# Patient Record
Sex: Male | Born: 1990 | Race: Black or African American | Hispanic: No | Marital: Single | Smoking: Never smoker
Health system: Southern US, Community
[De-identification: ages and names within clinical notes are randomized; demographics above are authoritative.]

## PROBLEM LIST (undated history)

## (undated) HISTORY — PX: HAND SURGERY: SHX662

## (undated) HISTORY — PX: KNEE SURGERY: SHX244

## (undated) HISTORY — PX: FINGER SURGERY: SHX640

---
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
## 2010-06-01 NOTE — Op Note (Signed)
Kyle Krueger, Kyle Krueger                         ACCOUNT NO.:  [id number]   MEDICAL RECORD NO.:  [id number]                   PATIENT TYPE:  AMB   LOCATION:  [facility]                                  FACILITY:  [facility]   PHYSICIAN:  Lowell Bouton, M.D.      DATE OF BIRTH:  September 07, 1990   DATE OF PROCEDURE:  06/03/2003  DATE OF DISCHARGE:                                 OPERATIVE REPORT   PREOPERATIVE DIAGNOSIS:  Closed intra-articular fracture proximal phalanx  left index finger.   POSTOPERATIVE DIAGNOSIS:  Closed intra-articular fracture proximal phalanx  left index finger.   PROCEDURE:  Closed reduction and percutaneous pinning of proximal phalanx  fracture, left index finger.   SURGEON:  Dr. Metro Kung   ANESTHESIA:  General.   OPERATIVE FINDINGS:  The patient had an oblique fracture through the distal  part of the proximal phalanx that extended into the PIP joint.  There was a  slight step-off in the joint.   DESCRIPTION OF PROCEDURE:  Under general anesthesia, the left hand was  prepped and draped in the usual fashion and under x-ray control, the  fracture was reduced.  A 3.5 K-wire was placed transversely across the  fracture, and x-rays showed good alignment.  This was done percutaneously.  A second K-wire was placed parallel to the first more distally and was  placed across the fracture site.  The pins were bent over, left protruding  through the skin.  X-rays revealed good alignment.  Sterile dressings were  applied followed by an aluminum foam splint.  The patient tolerated the  procedure well and went to the recovery room awake and stable in good  condition.                                               Lowell Bouton, M.D.    EMM/MEDQ  D:  06/03/2003  T:  06/04/2003  Job:  [id number]   cc:   Angus Seller. Rana Snare, M.D.  [address] [place]  [place] [address]  Fax: [phone]

## 2011-02-03 ENCOUNTER — Emergency Department (INDEPENDENT_AMBULATORY_CARE_PROVIDER_SITE_OTHER)
Admission: EM | Admit: 2011-02-03 | Discharge: 2011-02-03 | Disposition: A | Discharge status: Home / Self Care | Payer: 59 | Source: Home / Self Care | Attending: Family Medicine | Admitting: Family Medicine

## 2011-02-03 ENCOUNTER — Encounter (HOSPITAL_COMMUNITY): Payer: Self-pay | Admitting: *Deleted

## 2011-02-03 DIAGNOSIS — S0181XA Laceration without foreign body of other part of head, initial encounter: Secondary | ICD-10-CM

## 2011-02-03 DIAGNOSIS — S0180XA Unspecified open wound of other part of head, initial encounter: Secondary | ICD-10-CM

## 2011-02-03 NOTE — ED Provider Notes (Signed)
History     CSN: [id number]  Arrival date & time 02/03/11  4098   First MD Initiated Contact with Patient 02/03/11 1834      Chief Complaint  Patient presents with  . Laceration    (Consider location/radiation/quality/duration/timing/severity/associated sxs/prior treatment) Patient is a 21 y.o. male presenting with skin laceration. The history is provided by the patient.  Laceration  The incident occurred 1 to 2 hours ago. The laceration is located on the face. The laceration is 1 cm in size. The laceration mechanism was a a blunt object (struck with elbow playing basketball). The patient is experiencing no pain. He reports no foreign bodies present. His tetanus status is UTD.    History reviewed. No pertinent past medical history.  History reviewed. No pertinent past surgical history.  History reviewed. No pertinent family history.  History  Substance Use Topics  . Smoking status: Never Smoker   . Smokeless tobacco: Not on file  . Alcohol Use: No      Review of Systems  Constitutional: Negative.   Eyes: Negative.   Skin: Positive for wound.  Neurological: Negative.     Allergies  Review of patient's allergies indicates no known allergies.  Home Medications  No current outpatient prescriptions on file.  BP 132/87  Pulse 56  Temp(Src) 98.9 F (37.2 C) (Oral)  Resp 16  SpO2 100%  Physical Exam  Nursing note and vitals reviewed. Constitutional: He is oriented to person, place, and time. He appears well-developed and well-nourished.  HENT:  Head: Normocephalic.  Eyes: Conjunctivae and EOM are normal. Pupils are equal, round, and reactive to light.  Neurological: He is alert and oriented to person, place, and time.  Skin: Skin is warm and dry.  Psychiatric: He has a normal mood and affect.    ED Course  LACERATION REPAIR Date/Time: 02/03/2011 7:02 PM Performed by: Barkley Bruns Authorized by: Barkley Bruns Consent: Verbal consent  obtained. Consent given by: patient Patient understanding: patient states understanding of the procedure being performed Body area: head/neck Location details: left cheek Laceration length: 1.5 cm Anesthesia: local infiltration Local anesthetic: lidocaine 2% with epinephrine Anesthetic total: 2 ml Patient sedated: no Preparation: Patient was prepped and draped in the usual sterile fashion. Irrigation solution: saline Amount of cleaning: standard Debridement: none Degree of undermining: none Skin closure: 6-0 Prolene Technique: running Approximation: close Approximation difficulty: simple Dressing: antibiotic ointment Patient tolerance: Patient tolerated the procedure well with no immediate complications.   (including critical care time)  Labs Reviewed - No data to display No results found.   1. Facial laceration       MDM  Lac repair        Barkley Bruns, MD 02/03/11 1907

## 2011-02-03 NOTE — ED Notes (Addendum)
Pt with laceration under left eye playing basketball other player elbow  tetnus approx one year ago

## 2011-02-08 ENCOUNTER — Emergency Department (HOSPITAL_COMMUNITY)
Admission: EM | Admit: 2011-02-08 | Discharge: 2011-02-08 | Disposition: A | Discharge status: Home / Self Care | Payer: 59 | Source: Home / Self Care | Attending: Family Medicine | Admitting: Family Medicine

## 2011-02-08 ENCOUNTER — Encounter (HOSPITAL_COMMUNITY): Payer: Self-pay | Admitting: *Deleted

## 2011-02-08 DIAGNOSIS — Z4802 Encounter for removal of sutures: Secondary | ICD-10-CM

## 2011-02-08 NOTE — ED Provider Notes (Signed)
History     CSN: [id number]  Arrival date & time 02/08/11  1449   First MD Initiated Contact with Patient 02/08/11 1530      Chief Complaint  Patient presents with  . Suture / Staple Removal    (Consider location/radiation/quality/duration/timing/severity/associated sxs/prior treatment) Patient is a 21 y.o. male presenting with suture removal. The history is provided by the patient.  Suture / Staple Removal  The sutures were placed 3 to 6 days ago. Treatments since wound repair include antibiotic ointment use. There has been no drainage from the wound. There is no redness present. There is no swelling present. The pain has no pain.    History reviewed. No pertinent past medical history.  History reviewed. No pertinent past surgical history.  History reviewed. No pertinent family history.  History  Substance Use Topics  . Smoking status: Never Smoker   . Smokeless tobacco: Not on file  . Alcohol Use: No      Review of Systems  Constitutional: Negative.   Skin: Positive for wound.    Allergies  Review of patient's allergies indicates no known allergies.  Home Medications  No current outpatient prescriptions on file.  BP 107/61  Pulse 78  Temp(Src) 98.3 F (36.8 C) (Oral)  Resp 14  SpO2 98%  Physical Exam  Nursing note and vitals reviewed. Constitutional: He appears well-developed and well-nourished.  Skin: Skin is warm and dry.       Lac well healed, sutures removed.    ED Course  Procedures (including critical care time)  Labs Reviewed - No data to display No results found.   1. Visit for suture removal       MDM          Barkley Bruns, MD 02/08/11 1550

## 2011-02-08 NOTE — ED Notes (Signed)
Well  Healing  Sutures  Under  l  Eye  Here  For  Removal

## 2011-08-12 ENCOUNTER — Emergency Department (INDEPENDENT_AMBULATORY_CARE_PROVIDER_SITE_OTHER)
Admission: EM | Admit: 2011-08-12 | Discharge: 2011-08-12 | Disposition: A | Discharge status: Home / Self Care | Payer: 59 | Source: Home / Self Care | Attending: Emergency Medicine | Admitting: Emergency Medicine

## 2011-08-12 ENCOUNTER — Emergency Department (INDEPENDENT_AMBULATORY_CARE_PROVIDER_SITE_OTHER): Payer: 59

## 2011-08-12 ENCOUNTER — Encounter (HOSPITAL_COMMUNITY): Payer: Self-pay | Admitting: *Deleted

## 2011-08-12 DIAGNOSIS — S63509A Unspecified sprain of unspecified wrist, initial encounter: Secondary | ICD-10-CM

## 2011-08-12 MED ORDER — MELOXICAM 15 MG PO TABS: 15.0000 mg | ORAL_TABLET | Freq: Every day | ORAL | Status: DC

## 2011-08-12 MED ORDER — MELOXICAM 15 MG PO TABS: 15.0000 mg | ORAL_TABLET | Freq: Every day | ORAL | Status: AC

## 2011-08-12 MED ORDER — TRAMADOL HCL 50 MG PO TABS: 100.0000 mg | ORAL_TABLET | Freq: Three times a day (TID) | ORAL | Status: DC | PRN

## 2011-08-12 MED ORDER — TRAMADOL HCL 50 MG PO TABS: 100.0000 mg | ORAL_TABLET | Freq: Three times a day (TID) | ORAL | Status: AC | PRN

## 2011-08-12 NOTE — ED Provider Notes (Signed)
Chief Complaint  Patient presents with  . Wrist Pain    History of Present Illness:   Mr. Purohit is a 21 year old male who injured his left wrist 3 days ago. He got it caught in some tent cords at work. It was twisted. He's had pain over the ulnar aspect of the wrist ever since then. It was a little bit swollen the first couple days but now the swelling has gone down. He has pain with flexion and extension and with lateral deviation. He denies any numbness or tingling.  Review of Systems:  Other than noted above, the patient denies any of the following symptoms: Systemic:  No fevers, chills, sweats, or aches.  No fatigue or tiredness. Musculoskeletal:  No joint pain, arthritis, bursitis, swelling, back pain, or neck pain. Neurological:  No muscular weakness, paresthesias, headache, or trouble with speech or coordination.  No dizziness.   PMFSH:  Past medical history, family history, social history, meds, and allergies were reviewed.  Physical Exam:   Vital signs:  BP 138/62  Pulse 70  Temp 98.5 F (36.9 C) (Oral)  Resp 18  SpO2 100% Gen:  Alert and oriented times 3.  In no distress. Musculoskeletal: There is pain to palpation over the ulnar aspect of the wrist. No swelling, bruising, or deformity. The wrist has a full range of motion. There is no pain on flexion but some pain on extension and lateral deviation. Otherwise, all joints had a full a ROM with no swelling, bruising or deformity.  No edema, pulses full. Extremities were warm and pink.  Capillary refill was brisk.  Skin:  Clear, warm and dry.  No rash. Neuro:  Alert and oriented times 3.  Muscle strength was normal.  Sensation was intact to light touch.   Radiology:  Dg Wrist Complete Left  08/12/2011  *RADIOLOGY REPORT*  Clinical Data: Injury  LEFT WRIST - COMPLETE 3+ VIEW  Comparison: None.  Findings: Four views of the left wrist submitted. No acute fracture or subluxation.  No radiopaque foreign body.  IMPRESSION: No acute  fracture or subluxation.  Original Report Authenticated By: Natasha Mead, M.D.   Course in Urgent Care Center:   The patient was put in a Velcro wrist splint.  Assessment:  The encounter diagnosis was Wrist sprain.  Plan:   1.  The following meds were prescribed:   New Prescriptions   MELOXICAM (MOBIC) 15 MG TABLET    Take 1 tablet (15 mg total) by mouth daily.   TRAMADOL (ULTRAM) 50 MG TABLET    Take 2 tablets (100 mg total) by mouth every 8 (eight) hours as needed for pain.   2.  The patient was instructed in symptomatic care, including rest and activity, elevation, application of ice and compression.  Appropriate handouts were given. 3.  The patient was told to return if becoming worse in any way, if no better in 3 or 4 days, and given some red flag symptoms that would indicate earlier return.   4.  The patient was told to follow up here if no better in 2 weeks.   Reuben Likes, MD 08/12/11 [phone]

## 2011-08-12 NOTE — ED Notes (Signed)
Pt c/o left wrist pain /injury onset yesterday - per pt hand caught in tent strings twisted wrist

## 2011-10-28 ENCOUNTER — Emergency Department (HOSPITAL_COMMUNITY)
Admission: EM | Admit: 2011-10-28 | Discharge: 2011-10-28 | Disposition: A | Discharge status: Home / Self Care | Payer: 59 | Source: Home / Self Care | Attending: Emergency Medicine | Admitting: Emergency Medicine

## 2011-10-28 ENCOUNTER — Emergency Department (INDEPENDENT_AMBULATORY_CARE_PROVIDER_SITE_OTHER): Payer: 59

## 2011-10-28 ENCOUNTER — Encounter (HOSPITAL_COMMUNITY): Payer: Self-pay

## 2011-10-28 DIAGNOSIS — S4980XA Other specified injuries of shoulder and upper arm, unspecified arm, initial encounter: Secondary | ICD-10-CM

## 2011-10-28 DIAGNOSIS — S161XXA Strain of muscle, fascia and tendon at neck level, initial encounter: Secondary | ICD-10-CM

## 2011-10-28 DIAGNOSIS — S20219A Contusion of unspecified front wall of thorax, initial encounter: Secondary | ICD-10-CM

## 2011-10-28 DIAGNOSIS — S139XXA Sprain of joints and ligaments of unspecified parts of neck, initial encounter: Secondary | ICD-10-CM

## 2011-10-28 DIAGNOSIS — S43429A Sprain of unspecified rotator cuff capsule, initial encounter: Secondary | ICD-10-CM

## 2011-10-28 MED ORDER — MELOXICAM 15 MG PO TABS: 15.0000 mg | ORAL_TABLET | Freq: Every day | ORAL | Status: AC

## 2011-10-28 MED ORDER — TRAMADOL HCL 50 MG PO TABS: 100.0000 mg | ORAL_TABLET | Freq: Three times a day (TID) | ORAL | Status: AC | PRN

## 2011-10-28 NOTE — ED Notes (Signed)
States played football yest, and was tackled several times. Since then has had pain in neck, shoulder, chest, hurts to breathe ; NAD: pain w ROM right arm

## 2011-10-28 NOTE — ED Provider Notes (Signed)
Chief Complaint  Patient presents with  . Muscle Pain    History of Present Illness:  The patient is a 21 year old male who was playing football yesterday. He received several hits, but cannot recall any particularly severe injuries. After the game he had some aching and stiffness in his neck. After a while of this progressed to his right shoulder, right collarbone, and today is also progressed to the entire right hemithorax with worsening with deep inspiration. He feels somewhat short of breath but has had no cough or hemoptysis. He denies any headache or blurry vision. He is able to move his neck well but does have some pain. He denies any midline pain. He's had no swelling or bruising anywhere. He has pain with movement of the shoulder. He denies any pain radiating down the arm, numbness, tingling, or weakness. He denies any palpitations, presyncope, or syncope. He has no abdominal pain or lower extremity pain.  Review of Systems:  Other than noted above, the patient denies any of the following symptoms. Systemic:  No fever, chills, sweats, or fatigue. ENT:  No nasal congestion, rhinorrhea, or sore throat. Pulmonary:  No cough, wheezing, shortness of breath, sputum production, hemoptysis. Cardiac:  No palpitations, rapid heartbeat, dizziness, presyncope or syncope. GI:  No abdominal pain, heartburn, nausea, or vomiting. Ext:  No leg pain or swelling.  PMFSH:  Past medical history, family history, social history, meds, and allergies were reviewed and updated as needed.   Physical Exam:   Vital signs:  BP 159/51  Pulse 57  Temp 98.5 F (36.9 C) (Oral)  Resp 10 Gen:  Alert, oriented, in no distress, skin warm and dry. Eye:  PERRL, lids and conjunctivas normal.  Sclera non-icteric. ENT:  Mucous membranes moist, pharynx clear. Neck:  There is tenderness to palpation over the right trapezius ridge and over the collarbone, but no swelling or bruising. No obvious deformity. The neck had a full  range of motion with slight pain at the endpoints of movement. There was no pain to palpation at the midline. Lungs:  Clear to auscultation, no wheezes, rales or rhonchi.  No respiratory distress. Heart:  Regular rhythm.  No gallops, murmers, clicks or rubs. Chest:  He has some pain to palpation over the sternum, but not over the ribs. Abdomen:  Soft, nontender, no organomegaly or mass.  Bowel sounds normal.  No pulsatile abdominal mass or bruit. Ext:  There is no pain to palpation in either upper extremity. The shoulder however had a limited range of motion with pain on movement. Skin:  Warm and dry.  No rash.  Radiology:  Dg Ribs Unilateral W/chest Right  10/28/2011  *RADIOLOGY REPORT*  Clinical Data: URI, right clavicle injury, right anterior chest pain with taking a deep breath, question rib fracture  RIGHT RIBS AND CHEST - 3+ VIEW  Comparison: None  Findings: Borderline enlargement cardiac silhouette, likely accentuated by mild hypoinflation. Mediastinal contours and pulmonary vascularity normal. Slight decreased lung volumes without infiltrate, pleural effusion, or pneumothorax. Clavicles appear intact. Osseous mineralization normal. No rib fracture or bone destruction identified.  IMPRESSION: Slightly decreased lung volumes. No definite rib fractures identified.   Original Report Authenticated By: Lollie Marrow, M.D.    Dg Clavicle Right  10/28/2011  *RADIOLOGY REPORT*  Clinical Data: Mid clavicular pain, football injury yesterday  RIGHT CLAVICLE - 2+ VIEWS  Comparison: None.  Findings: Two views of the right clavicle submitted.  No acute fracture or subluxation.  No radiopaque foreign body.  IMPRESSION:  No acute fracture or subluxation.   Original Report Authenticated By: Natasha Mead, M.D.    I reviewed the images independently and personally and concur with the radiologist's findings.  Assessment:  The primary encounter diagnosis was Cervical strain. Diagnoses of Chest wall contusion,  Contusion of clavicle, and Rotator cuff sprain were also pertinent to this visit.  Plan:   1.  The following meds were prescribed:   New Prescriptions   MELOXICAM (MOBIC) 15 MG TABLET    Take 1 tablet (15 mg total) by mouth daily.   TRAMADOL (ULTRAM) 50 MG TABLET    Take 2 tablets (100 mg total) by mouth every 8 (eight) hours as needed for pain.   2.  The patient was instructed in symptomatic care and handouts were given. 3.  The patient was told to return if becoming worse in any way, if no better in 3 or 4 days, and given some red flag symptoms that would indicate earlier return.    Reuben Likes, MD 10/28/11 [phone]

## 2012-07-30 ENCOUNTER — Emergency Department (HOSPITAL_COMMUNITY)
Admission: EM | Admit: 2012-07-30 | Discharge: 2012-07-30 | Disposition: A | Discharge status: Home / Self Care | Payer: 59 | Source: Home / Self Care

## 2012-07-30 DIAGNOSIS — IMO0002 Reserved for concepts with insufficient information to code with codable children: Secondary | ICD-10-CM

## 2012-07-30 DIAGNOSIS — S76311A Strain of muscle, fascia and tendon of the posterior muscle group at thigh level, right thigh, initial encounter: Secondary | ICD-10-CM

## 2012-07-30 MED ORDER — NAPROXEN 500 MG PO TABS: 500.0000 mg | ORAL_TABLET | Freq: Two times a day (BID) | ORAL | Status: AC

## 2012-07-30 MED ORDER — TRAMADOL HCL 50 MG PO TABS: 100.0000 mg | ORAL_TABLET | Freq: Three times a day (TID) | ORAL | Status: AC | PRN

## 2012-07-30 NOTE — ED Notes (Signed)
Patient states was playing basketball on Tuesday when he did hear a pop.  States his right hamstring is hurting radiating down to knee cap.  Ibuprofen taken but did not relieve pain.  Patient did R.I.C.E. The right leg.

## 2012-07-30 NOTE — ED Provider Notes (Signed)
History    CSN: [id number] Arrival date & time 07/30/12  4098  None    Chief Complaint  Patient presents with  . Leg Pain   (Consider location/radiation/quality/duration/timing/severity/associated sxs/prior Treatment) HPI Comments: 22 year old male presents complaining of pain in his right hamstring. He was playing basketball and went to take a step when he felt a pop in the back of his leg and immediate pain. He says that he has pain with any movement as well as pain with anything except keeping the leg exactly straight. He has been attempting to rest it, elevate it, and apply ice as much as possible but it is not helping. He is also taking ibuprofen which did not help much either. He says he is now getting any tender knot in the back of his leg just above the knee, lateral side. He denies history of similar injury. Denies fever, chills, or any swelling distal to the hamstring.  Patient is a 22 y.o. male presenting with leg pain.  Leg Pain Associated symptoms: no fatigue, no fever and no neck pain    No past medical history on file. No past surgical history on file. No family history on file. History  Substance Use Topics  . Smoking status: Never Smoker   . Smokeless tobacco: Not on file  . Alcohol Use: No    Review of Systems  Constitutional: Negative for fever, chills and fatigue.  HENT: Negative for sore throat, neck pain and neck stiffness.   Eyes: Negative for visual disturbance.  Respiratory: Negative for cough and shortness of breath.   Cardiovascular: Negative for chest pain, palpitations and leg swelling.  Gastrointestinal: Negative for nausea, vomiting, abdominal pain, diarrhea and constipation.  Genitourinary: Negative for dysuria, urgency, frequency and hematuria.  Musculoskeletal: Positive for myalgias (See history of present illness). Negative for arthralgias.  Skin: Negative for rash.  Neurological: Negative for dizziness, weakness and light-headedness.     Allergies  Review of patient's allergies indicates no known allergies.  Home Medications   Current Outpatient Rx  Name  Route  Sig  Dispense  Refill  . ibuprofen (ADVIL,MOTRIN) 600 MG tablet   Oral   Take 600 mg by mouth every 6 (six) hours as needed.         . meloxicam (MOBIC) 15 MG tablet   Oral   Take 1 tablet (15 mg total) by mouth daily.   15 tablet   0   . meloxicam (MOBIC) 15 MG tablet   Oral   Take 1 tablet (15 mg total) by mouth daily.   15 tablet   0   . naproxen (NAPROSYN) 500 MG tablet   Oral   Take 1 tablet (500 mg total) by mouth 2 (two) times daily.   60 tablet   2   . traMADol (ULTRAM) 50 MG tablet   Oral   Take 2 tablets (100 mg total) by mouth every 8 (eight) hours as needed for pain.   30 tablet   0   . traMADol (ULTRAM) 50 MG tablet   Oral   Take 2 tablets (100 mg total) by mouth every 8 (eight) hours as needed for pain.   30 tablet   0    BP 127/84  Pulse 61  Temp(Src) 98.4 F (36.9 C) (Oral)  SpO2 97% Physical Exam  Constitutional: He is oriented to person, place, and time. He appears well-developed and well-nourished.  HENT:  Head: Normocephalic and atraumatic.  Neck: Normal range of motion.  Musculoskeletal:  Right upper leg: He exhibits tenderness and swelling.  Bruising of the distal medial hamstrings. Tenderness on the lateral hamstrings up to the mid thigh posteriorly. No tenderness in the buttocks  Neurological: He is alert and oriented to person, place, and time. Coordination normal.  Skin: Skin is warm and dry. No rash noted.  Psychiatric: He has a normal mood and affect. Judgment normal.    ED Course  Procedures (including critical care time) Labs Reviewed - No data to display No results found. 1. Hamstring tear, right, initial encounter    Ultrasound performed by Dr. Denyse Amass reveals tear in the muscle belly with hematoma MDM  The ultrasound shows that this is a tear, rupture is very unlikely. We will  treat this with NSAIDs, tramadol when necessary for pain, and compression Ace wrap for support. The patient will be referred to outpatient sports medicine clinic for followup and physical therapy   Meds ordered this encounter  Medications  . naproxen (NAPROSYN) 500 MG tablet    Sig: Take 1 tablet (500 mg total) by mouth 2 (two) times daily.    Dispense:  60 tablet    Refill:  2  . traMADol (ULTRAM) 50 MG tablet    Sig: Take 2 tablets (100 mg total) by mouth every 8 (eight) hours as needed for pain.    Dispense:  30 tablet    Refill:  0     Graylon Good, PA-C 07/30/12 [phone]

## 2012-07-30 NOTE — ED Provider Notes (Signed)
Limited musculoskeletal ultrasound of the right posterior thigh. Significant superficial hematoma located on the lateral portion of the hamstring muscle belly. Deep hematoma with hypoechoic change in muscle belly disruption located in the midportion of the muscle belly of the lateral hamstring extending approximately 10 cm indicating acute hamstring muscle belly tear.   Rodolph Bong, MD 07/30/12 [phone]

## 2012-07-30 NOTE — ED Provider Notes (Signed)
Medical screening examination/treatment/procedure(s) were performed by a resident physician or non-physician practitioner and as the supervising physician I was immediately available for consultation/collaboration.  Aj Crunkleton, MD   Anushree Dorsi S Lalla Laham, MD 07/30/12 1115 

## 2012-08-03 ENCOUNTER — Encounter (HOSPITAL_COMMUNITY): Payer: Self-pay

## 2012-08-03 ENCOUNTER — Emergency Department (HOSPITAL_COMMUNITY)
Admission: EM | Admit: 2012-08-03 | Discharge: 2012-08-03 | Disposition: A | Discharge status: Home / Self Care | Payer: 59 | Source: Home / Self Care

## 2012-08-03 DIAGNOSIS — S76311D Strain of muscle, fascia and tendon of the posterior muscle group at thigh level, right thigh, subsequent encounter: Secondary | ICD-10-CM

## 2012-08-03 DIAGNOSIS — IMO0002 Reserved for concepts with insufficient information to code with codable children: Secondary | ICD-10-CM

## 2012-08-03 MED ORDER — HYDROCODONE-ACETAMINOPHEN 7.5-325 MG PO TABS: 1.0000 | ORAL_TABLET | ORAL | Status: AC | PRN

## 2012-08-03 NOTE — ED Provider Notes (Signed)
History    CSN: [id number] Arrival date & time 08/03/12  1717  First MD Initiated Contact with Patient 08/03/12 1903     Chief Complaint  Patient presents with  . 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 He was seen at that time and the urgent care. He was treated with a wrap and a limited time out of work and tramadol. He states that he has continued to work and walk all the hospital and now his pain is much worse. Is also an increase in area of deep bruising.  History reviewed. No pertinent past medical history. History reviewed. No pertinent past surgical history. History reviewed. No pertinent family history. History  Substance Use Topics  . Smoking status: Never Smoker   . Smokeless tobacco: Not on file  . Alcohol Use: No    Review of Systems  Constitutional: Negative.   Respiratory: Negative.   Gastrointestinal: Negative.   Genitourinary: Negative.   Musculoskeletal:       As per HPI  Skin: Negative.   Neurological: Negative for dizziness, weakness, numbness and headaches.    Allergies  Review of patient's allergies indicates no known allergies.  Home Medications   Current Outpatient Rx  Name  Route  Sig  Dispense  Refill  . naproxen (NAPROSYN) 500 MG tablet   Oral   Take 1 tablet (500 mg total) by mouth 2 (two) times daily.   60 tablet   2   . traMADol (ULTRAM) 50 MG tablet   Oral   Take 2 tablets (100 mg total) by mouth every 8 (eight) hours as needed for pain.   30 tablet   0   . HYDROcodone-acetaminophen (NORCO) 7.5-325 MG per tablet   Oral   Take 1 tablet by mouth every 4 (four) hours as needed for pain.   16 tablet   0   . ibuprofen (ADVIL,MOTRIN) 600 MG tablet   Oral   Take 600 mg by mouth every 6 (six) hours as needed.          . meloxicam (MOBIC) 15 MG tablet   Oral   Take 1 tablet (15 mg total) by mouth daily.   15 tablet   0   . meloxicam (MOBIC) 15 MG tablet   Oral   Take 1 tablet (15 mg total) by mouth daily.   15 tablet   0   . traMADol (ULTRAM) 50 MG tablet   Oral   Take 2 tablets (100 mg total) by mouth every 8 (eight) hours as needed for pain.   30 tablet   0    BP 136/80  Pulse 67  Temp(Src) 98 F (36.7 C) (Oral)  Resp 14  SpO2 100% Physical Exam  Nursing note and vitals reviewed. Constitutional: He is oriented to person, place, and time. He appears well-developed and well-nourished.  HENT:  Head: Normocephalic and atraumatic.  Eyes: EOM are normal. Left eye exhibits no discharge.  Neck: Normal range of motion. Neck supple.  Musculoskeletal: He exhibits tenderness.  Minimal edema to the hamstring. There is ecchymosis to the distal aspect of the hamstring and lesser to the proximal calcaneus. Flection remains limited to 5. No deformity. Distal neurovascular and sensory is intact.  Neurological: He is alert and oriented to person, place, and time. No cranial nerve deficit.  Skin: Skin is  warm and dry.  Psychiatric: He has a normal mood and affect.    ED Course  Procedures (including critical care time) Labs Reviewed - No data to display No results found. 1. Hamstring muscle strain, right, subsequent encounter     MDM  Norco 7.5 Milligrams every 4 hours when necessary pain #16 Continue to wear the wrap as directed, primarily while standing or walking. He also continued apply ice his lungs it helps. Must call the number given to you last visit for followup and rehabilitation. Footnote to limit walking falling a few feet for the next 5 days.  Hayden Rasmussen, NP 08/03/12 1921

## 2012-08-03 NOTE — ED Provider Notes (Signed)
Medical screening examination/treatment/procedure(s) were performed by resident physician or non-physician practitioner and as supervising physician I was immediately available for consultation/collaboration.   KINDL,JAMES DOUGLAS MD.   James D Kindl, MD 08/03/12 2030 

## 2012-08-03 NOTE — ED Notes (Signed)
Here for worsening pain and swelling in hamstring area

## 2012-08-17 ENCOUNTER — Ambulatory Visit (INDEPENDENT_AMBULATORY_CARE_PROVIDER_SITE_OTHER): Payer: Worker's Compensation | Admitting: Sports Medicine

## 2012-08-17 VITALS — BP 124/70 | Ht 69.0 in | Wt 230.0 lb

## 2012-08-17 DIAGNOSIS — S76312A Strain of muscle, fascia and tendon of the posterior muscle group at thigh level, left thigh, initial encounter: Secondary | ICD-10-CM

## 2012-08-17 DIAGNOSIS — IMO0002 Reserved for concepts with insufficient information to code with codable children: Secondary | ICD-10-CM

## 2012-08-17 NOTE — Progress Notes (Signed)
Kyle Krueger is a 22 y.o. male who presents today for R hamstring injury.  Pt states he was playing basketball around 3-4 weeks ago when he strained his R hamstring.  He had pain at that time but was able to move and made a full recovery.  However, one week later, pt was at work and had a food cart ride up on his achilles, and as he was plantarflexing his R ankle, his leg got stuck and he heard/felt a pop of his R hamstring.  He had severe pain in the posterior thigh as well as immediate edema.  Pt went to urgent care where they dx him with a hamstring tear.  He was started on Naprosyn and Ultram at that time for pain and this did help his pain somewhat.  Pt developed posterior thigh ecchymosis over the last week that he has not seen dissipate over that time as well.  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     Current Outpatient Prescriptions on File Prior to Visit  Medication Sig Dispense Refill  . HYDROcodone-acetaminophen (NORCO) 7.5-325 MG per tablet Take 1 tablet by mouth every 4 (four) hours as needed for pain.  16 tablet  0  . ibuprofen (ADVIL,MOTRIN) 600 MG tablet Take 600 mg by mouth every 6 (six) hours as needed.      . meloxicam (MOBIC) 15 MG tablet Take 1 tablet (15 mg total) by mouth daily.  15 tablet  0  . naproxen (NAPROSYN) 500 MG tablet Take 1 tablet (500 mg total) by mouth 2 (two) times daily.  60 tablet  2  . traMADol (ULTRAM) 50 MG tablet Take 2 tablets (100 mg total) by mouth every 8 (eight) hours as needed for pain.  30 tablet  0  . traMADol (ULTRAM) 50 MG tablet Take 2 tablets (100 mg total) by mouth every 8 (eight) hours as needed for pain.  30 tablet  0   No current facility-administered medications on file prior to visit.    ROS: Per HPI.  All other systems reviewed and are negative.   Physical Exam Filed Vitals:   08/17/12 0901  BP: 124/70    Physical Examination: Musculoskeletal - R hamstring - TTP distal medial  aspect of posterior thigh with ecchymosis 2 x 2 cm. No palpable defect.  MS 4/5 R with knee extension and Full ROM.  L - Full ROM, no TTP.  5/5 MS with knee F/E   MSK [place] - R hamstring - US probe applied to posterior R hamstring.   Long view - Evidence of hypoechoic area in distal long head of the biceps femoris. No disruption of short head of biceps, semitendinosus, or semimembranous.    Short View - Hypoechoic area on distal aspect of long head of biceps femoris.  No disruption of short head of biceps femoris with smooth junction at tendinous junction

## 2012-08-17 NOTE — Patient Instructions (Addendum)
Hamstring exercises printed, explained, and given to patient.

## 2012-08-17 NOTE — Assessment & Plan Note (Addendum)
R hamstring 2nd degree strain with partial tear of the distal aspect of the long head of the biceps femoris.  Pt has FROM at this time with minimal ecchymosis/edema of the lateral posterior thigh. Recommend light activity for 2-3 weeks with rehabilitation including diver, extender, and eccentric strengthening as tolerated.  Thigh sleeve given for support and will see back in 3-4 weeks.  Note given for limitations at [employer] camp which he starts in 4 days. Cont current light duty restrictions until follow up.

## 2013-09-06 ENCOUNTER — Ambulatory Visit: Payer: 59 | Admitting: Internal Medicine

## 2013-09-06 DIAGNOSIS — Z0289 Encounter for other administrative examinations: Secondary | ICD-10-CM

## 2019-11-15 ENCOUNTER — Ambulatory Visit
Admission: RE | Admit: 2019-11-15 | Discharge: 2019-11-15 | Disposition: A | Discharge status: Home / Self Care | Payer: Self-pay | Source: Ambulatory Visit | Attending: Nurse Practitioner | Admitting: Nurse Practitioner

## 2019-11-15 ENCOUNTER — Other Ambulatory Visit: Payer: Self-pay

## 2019-11-15 ENCOUNTER — Other Ambulatory Visit: Payer: Self-pay | Admitting: Nurse Practitioner

## 2019-11-15 DIAGNOSIS — Z021 Encounter for pre-employment examination: Secondary | ICD-10-CM

## 2021-03-06 ENCOUNTER — Emergency Department (HOSPITAL_BASED_OUTPATIENT_CLINIC_OR_DEPARTMENT_OTHER)
Admission: EM | Admit: 2021-03-06 | Discharge: 2021-03-06 | Disposition: A | Discharge status: Home / Self Care | Payer: Medicaid Other | Attending: Emergency Medicine | Admitting: Emergency Medicine

## 2021-03-06 ENCOUNTER — Emergency Department (HOSPITAL_BASED_OUTPATIENT_CLINIC_OR_DEPARTMENT_OTHER): Payer: Medicaid Other

## 2021-03-06 ENCOUNTER — Other Ambulatory Visit: Payer: Self-pay

## 2021-03-06 ENCOUNTER — Encounter (HOSPITAL_BASED_OUTPATIENT_CLINIC_OR_DEPARTMENT_OTHER): Payer: Self-pay | Admitting: *Deleted

## 2021-03-06 DIAGNOSIS — R001 Bradycardia, unspecified: Secondary | ICD-10-CM | POA: Insufficient documentation

## 2021-03-06 DIAGNOSIS — M62830 Muscle spasm of back: Secondary | ICD-10-CM | POA: Insufficient documentation

## 2021-03-06 DIAGNOSIS — X500XXA Overexertion from strenuous movement or load, initial encounter: Secondary | ICD-10-CM | POA: Insufficient documentation

## 2021-03-06 DIAGNOSIS — M545 Low back pain, unspecified: Secondary | ICD-10-CM | POA: Insufficient documentation

## 2021-03-06 DIAGNOSIS — Y9367 Activity, basketball: Secondary | ICD-10-CM | POA: Diagnosis not present

## 2021-03-06 MED ORDER — KETOROLAC TROMETHAMINE 30 MG/ML IJ SOLN
30.0000 mg | Freq: Once | INTRAMUSCULAR | Status: DC
  Filled 2021-03-06: qty 1

## 2021-03-06 MED ORDER — KETOROLAC TROMETHAMINE 30 MG/ML IJ SOLN
30.0000 mg | Freq: Once | INTRAMUSCULAR | Status: AC
  Administered 2021-03-06: 30 mg via INTRAMUSCULAR

## 2021-03-06 MED ORDER — PREDNISONE 10 MG PO TABS: 30.0000 mg | ORAL_TABLET | Freq: Every day | ORAL | 0 refills | Status: AC

## 2021-03-06 MED ORDER — METHOCARBAMOL 500 MG PO TABS: 500.0000 mg | ORAL_TABLET | Freq: Every evening | ORAL | 0 refills | Status: AC

## 2021-03-06 MED ORDER — NAPROXEN 250 MG PO TABS
500.0000 mg | ORAL_TABLET | Freq: Once | ORAL | Status: AC
  Administered 2021-03-06: 500 mg via ORAL
  Filled 2021-03-06: qty 2

## 2021-03-06 NOTE — ED Notes (Signed)
Pt verbalized understanding to pick up prescriptions at pharmacy listed on d/c instructions.  °

## 2021-03-06 NOTE — ED Notes (Signed)
Patient transported to X-ray 

## 2021-03-06 NOTE — ED Triage Notes (Signed)
Lower back pain for a week. Coughing and movement worsens the pain.

## 2021-03-06 NOTE — ED Provider Notes (Signed)
[facility] Provider Note   CSN: [id number] Arrival date & time: 03/06/21  1249     History  Chief Complaint  Patient presents with   Back Pain    Kyle Krueger is a 31 y.o. male presenting today with new onset of lower back pain.  Patient states about 5-6 days ago he was moving some boxes at work when he felt some mild tenderness in his lower back.  Then on Saturday when he was playing basketball, he felt a sharp pain and "twinge" in the same area of his lower back.  Denies numbness or tingling, saddle anesthesia, urinary/bowel incontinence, or lower extremity weakness.  Pain described as localized and nonradiating, and sharp in quality.  Pain worsened with movement or with palpation.  Denies fever, recent illness/infection, or recent drug use.  States pain has mildly improved since Saturday, but still has some difficulty walking and with bending/twisting.  No history of spinal surgery or previous known spinal fracture.  Denies nausea, vomiting, constipation, diarrhea.  Denies shortness of breath or abdominal pain.  Denies recent illness or recent sick contacts.  The history is provided by the patient and medical records.  Back Pain     Home Medications Prior to Admission medications   Medication Sig Start Date End Date Taking? Authorizing Provider  methocarbamol (ROBAXIN) 500 MG tablet Take 1 tablet (500 mg total) by mouth at bedtime for 5 days. 03/06/21 03/11/21 Yes Prince Rome, PA-C  predniSONE (DELTASONE) 10 MG tablet Take 3 tablets (30 mg total) by mouth daily for 5 days. 03/06/21 03/11/21 Yes Prince Rome, PA-C  HYDROcodone-acetaminophen (NORCO) 7.5-325 MG per tablet Take 1 tablet by mouth every 4 (four) hours as needed for pain. 08/03/12   Janne Napoleon, NP  ibuprofen (ADVIL,MOTRIN) 600 MG tablet Take 600 mg by mouth every 6 (six) hours as needed.    [provider]  meloxicam (MOBIC) 15 MG tablet Take 1 tablet (15 mg total) by  mouth daily. 10/28/11   Harden Mo, MD  naproxen (NAPROSYN) 500 MG tablet Take 1 tablet (500 mg total) by mouth 2 (two) times daily. 07/30/12   Luana Shu, Freeman Caldron, PA-C  traMADol (ULTRAM) 50 MG tablet Take 2 tablets (100 mg total) by mouth every 8 (eight) hours as needed for pain. 10/28/11   Harden Mo, MD  traMADol (ULTRAM) 50 MG tablet Take 2 tablets (100 mg total) by mouth every 8 (eight) hours as needed for pain. 07/30/12   Liam Graham, PA-C      Allergies    Patient has no known allergies.    Review of Systems   Review of Systems  Musculoskeletal:  Positive for back pain.   Physical Exam Updated Vital Signs BP 135/81 (BP Location: Right Arm)    Pulse (!) 51    Temp 98.2 F (36.8 C) (Oral)    Resp 18    Ht 5\' 9"  (1.753 m)    Wt 117.9 kg    SpO2 100%    BMI 38.40 kg/m  Physical Exam Vitals and nursing note reviewed.  Constitutional:      General: He is not in acute distress.    Appearance: Normal appearance. He is well-developed. He is not ill-appearing or diaphoretic.  HENT:     Head: Normocephalic and atraumatic.  Eyes:     General: No scleral icterus.    Conjunctiva/sclera: Conjunctivae normal.  Cardiovascular:     Rate and Rhythm: Regular rhythm. Bradycardia present.  Pulses: Normal pulses.     Heart sounds: Normal heart sounds. No murmur heard. Pulmonary:     Effort: Pulmonary effort is normal. No respiratory distress.     Breath sounds: Normal breath sounds.  Abdominal:     Palpations: Abdomen is soft.     Tenderness: There is no abdominal tenderness.  Musculoskeletal:        General: No swelling.     Cervical back: Neck supple. No rigidity or tenderness.     Thoracic back: Normal.     Lumbar back: Spasms, tenderness and bony tenderness present. No swelling, edema, deformity, signs of trauma or lacerations.       Back:     Right lower leg: No edema.     Left lower leg: No edema.     Comments: TTP as indicated above Decreased range of motion due  to elicited tenderness   Skin:    General: Skin is warm.     Capillary Refill: Capillary refill takes less than 2 seconds.     Findings: No bruising, erythema or rash.  Neurological:     Mental Status: He is alert and oriented to person, place, and time.     GCS: GCS eye subscore is 4. GCS verbal subscore is 5. GCS motor subscore is 6.     Cranial Nerves: No cranial nerve deficit.     Sensory: Sensation is intact. No sensory deficit.     Motor: No weakness.     Gait: Gait normal.     Comments: No sensory deficit observed of the lower extremities or saddle region.  Psychiatric:        Mood and Affect: Mood normal.    ED Results / Procedures / Treatments   Labs (all labs ordered are listed, but only abnormal results are displayed) Labs Reviewed - No data to display  EKG None  Radiology DG Lumbar Spine Complete  Result Date: 03/06/2021 CLINICAL DATA:  Low back pain EXAM: LUMBAR SPINE - COMPLETE 4+ VIEW COMPARISON:  None. FINDINGS: No recent fracture is seen. There is mild retrolisthesis at L5-S1 level. There is disc space narrowing at L5-S1 level. Minimal decrease in height of anterior aspect of lower thoracic and upper lumbar vertebral bodies may be residual from remote injury. Paraspinal soft tissues are unremarkable. IMPRESSION: No recent fracture is seen in the lumbar spine. There is disc space narrowing and mild retrolisthesis at L5-S1 level. Electronically Signed   By: Elmer Picker M.D.   On: 03/06/2021 18:22    Procedures Procedures    Medications Ordered in ED Medications  naproxen (NAPROSYN) tablet 500 mg (500 mg Oral Given 03/06/21 1814)  ketorolac (TORADOL) 30 MG/ML injection 30 mg (30 mg Intramuscular Given 03/06/21 1821)    ED Course/ Medical Decision Making/ A&P                           Medical Decision Making Amount and/or Complexity of Data Reviewed Radiology: ordered.  Risk Prescription drug management.   31 year old male presents to the ED for  concern of pain.  This involves an extensive number of treatment options, and is a complaint that carries with it a high risk of complications and morbidity.  The differential diagnosis includes fracture, musculoskeletal strain or sprain, or herniated nucleus pulposis  Additional history obtained from internal/external records available via epic  Interpretation: I ordered imaging studies including x-ray of the lumbar spine.  I independently visualized and interpreted imaging which  showed negative for acute bony pathology, but is suggestive of disc space narrowing and mild retrolisthesis at L5-S1 level.  I agree with the radiologist interpretation  Test Considered: None   Critical Interventions: None   Consultations Obtained: None  Intervention: I ordered medication including Toradol and naproxen for pain relief.  Reevaluation of the patient after these medicines showed that the patient moderate improvement.  I have reviewed the patients home medicines and have made adjustments as needed  ED Course: Patient with left-central low back pain around L5.  No neurological deficits and normal neuro exam.  X-ray imaging of lumbar spine negative for acute bony pathology, but is suggestive of disc space narrowing and mild retrolisthesis at L5-S1 level.  Patient can walk but states is mildly painful.  No loss of bowel or bladder control.  No concern for cauda equina or spinal epidural abscess.  No fever, night sweats, weight loss, h/o cancer, IVDU.  Rest with heat protocol, short course of steroids, and pain medicine indicated and discussed with patient.  May utilize muscle relaxant at night as needed.  Patient already has orthopedic office/patient care established at different facility.  Encouraged follow-up with them outpatient.  Social Determinants of Health: None  Dispostion: I discussed the patient and their case with my attending, Dr. Tamera Punt and Deno Etienne PA-C, who agreed with the proposed  treatment course.  After consideration of the diagnostic results and the patient's response to treatment, I feel that the patent would benefit from outpatient follow-up with orthopedics and follow-up with primary care for reevaluation and further assessment.  Discussed course of treatment thoroughly with the patient and he demonstrated understanding.  Patient in agreement and has no further questions.        Final Clinical Impression(s) / ED Diagnoses Final diagnoses:  Acute left-sided low back pain without sciatica    Rx / DC Orders ED Discharge Orders          Ordered    predniSONE (DELTASONE) 10 MG tablet  Daily        03/06/21 1832    methocarbamol (ROBAXIN) 500 MG tablet  Nightly        03/06/21 1832              Prince Rome, Hershal Coria [id number] Berenice Primas, MD 03/06/21 [phone]

## 2021-03-06 NOTE — Discharge Instructions (Addendum)
Prescriptions have been sent to your pharmacy for the medications of Prednisone and Robaxin.  Further information about these medications has been provided in your discharge packet.  You may also use over-the-counter Aleve or Tylenol for additional pain relief as directed.  You have also been provided with information on heat therapy to utilize for your back pain.  Return to the ED for new or worsening symptoms as discussed.  Schedule a follow-up with your regular orthopedic office, and if unable to be seen within the next few days, follow-up with your primary care for reevaluation.

## 2022-06-18 IMAGING — DX DG LUMBAR SPINE COMPLETE 4+V
5 series · 5 of 5 positions shown · non-contrast
Comparison: None.

CLINICAL DATA: Low back pain

EXAM:
LUMBAR SPINE - COMPLETE 4+ VIEW

[l-spine ap]
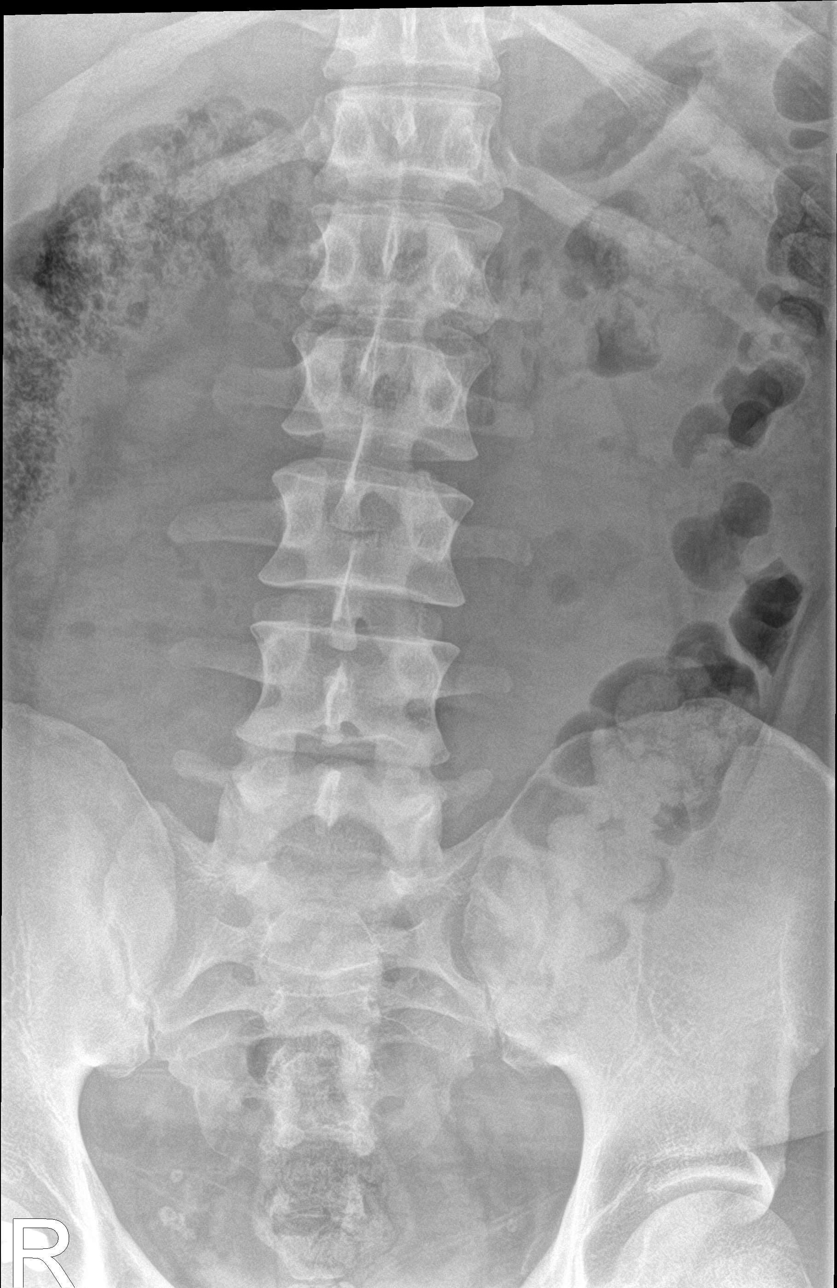

[l-spine obl (1 of 2)]
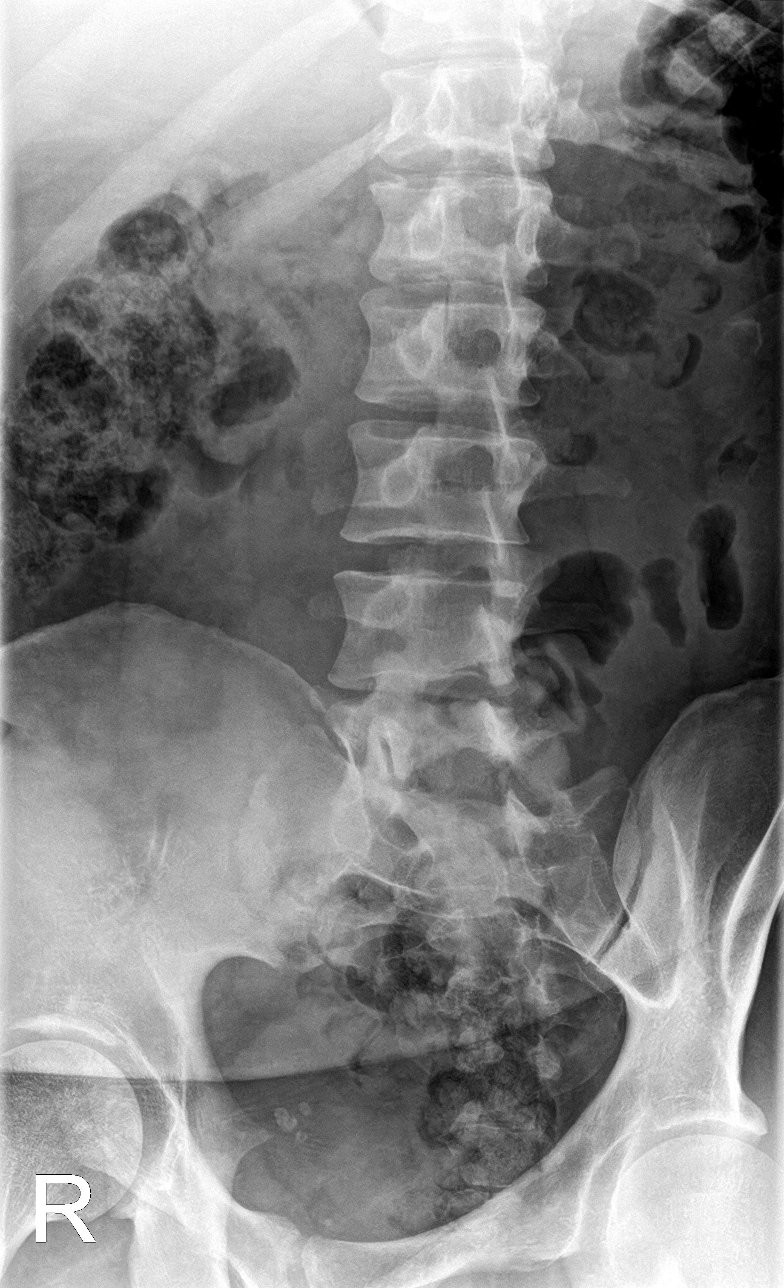

[l-spine obl (2 of 2)]
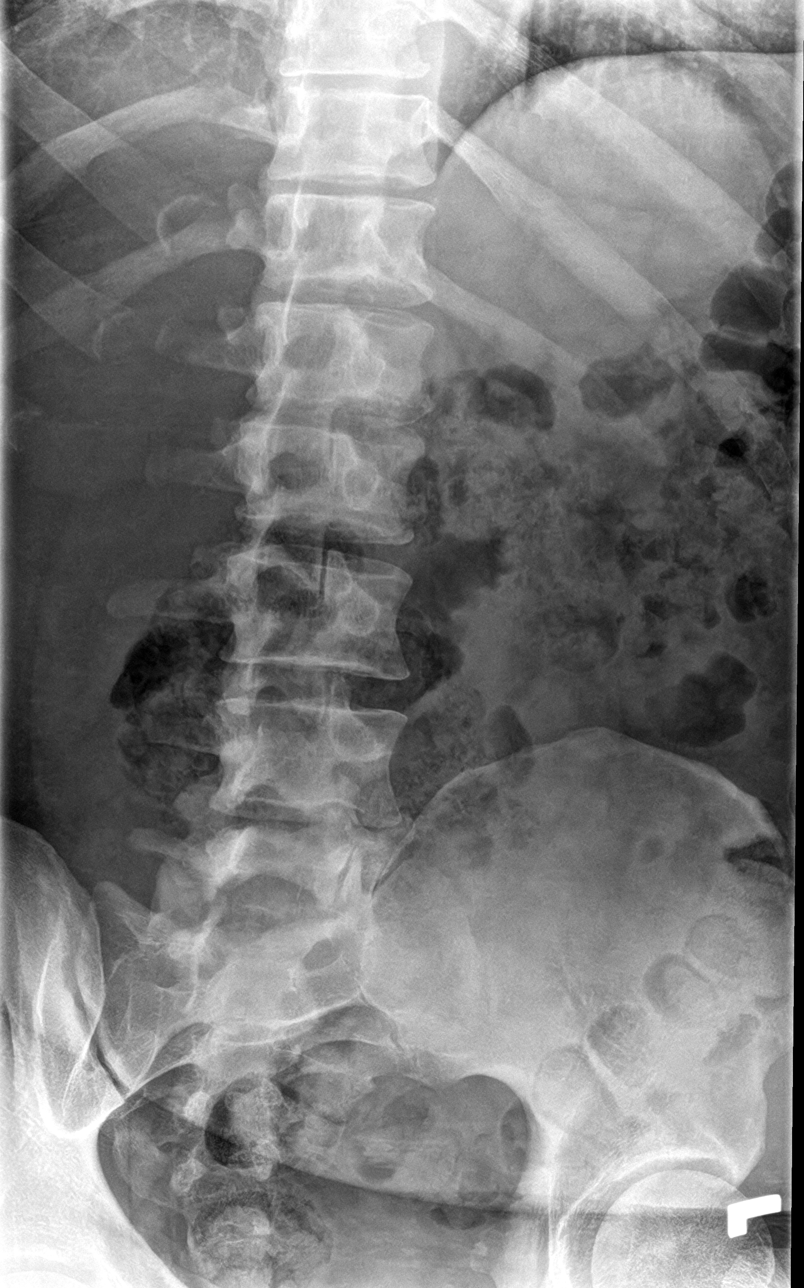

[l-spine lat]
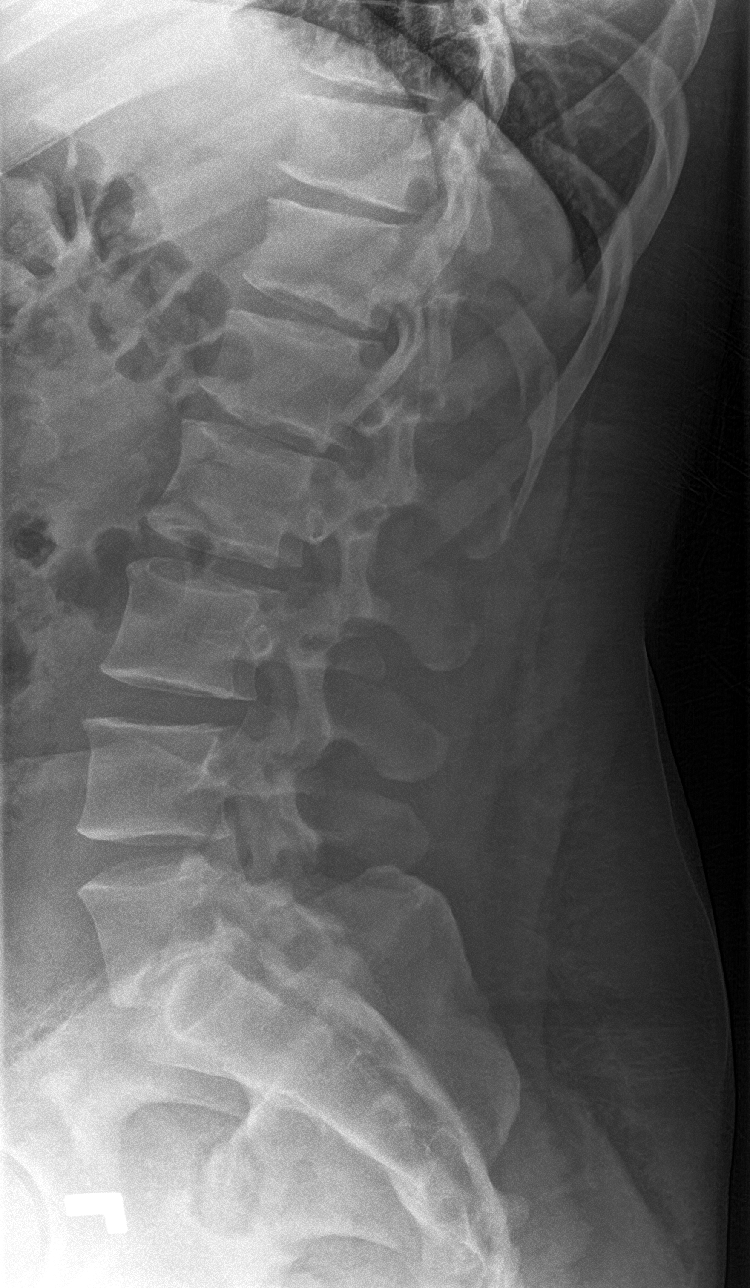

[l-spine spot]
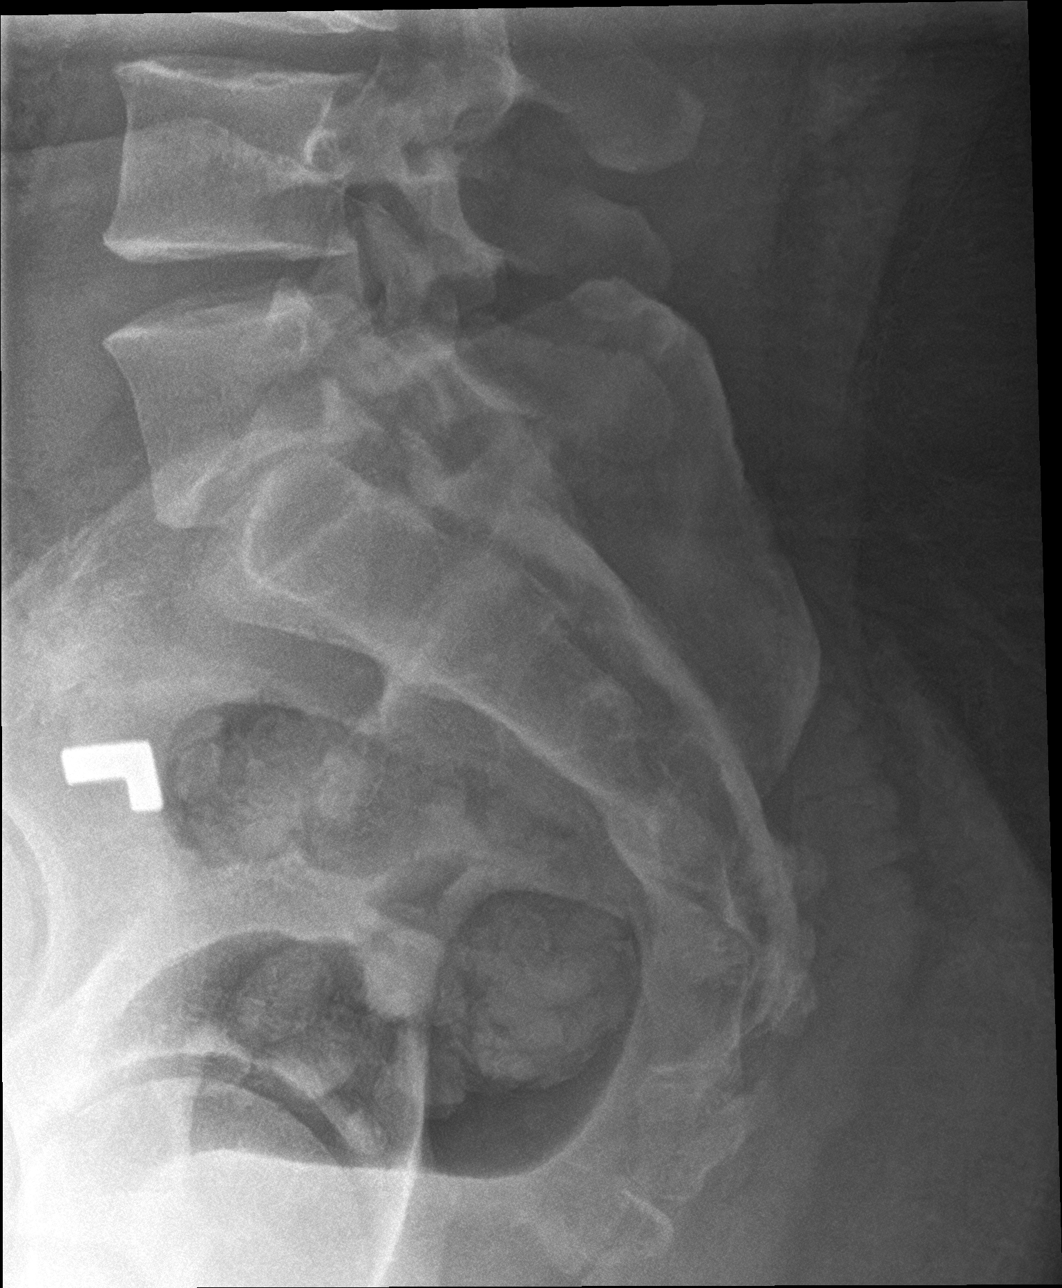

[5 of 5 positions shown; findings below may reference images not displayed]

FINDINGS: No recent fracture is seen. There is mild retrolisthesis at L5-S1
level. There is disc space narrowing at L5-S1 level. Minimal
decrease in height of anterior aspect of lower thoracic and upper
lumbar vertebral bodies may be residual from remote injury.
Paraspinal soft tissues are unremarkable.
IMPRESSION: No recent fracture is seen in the lumbar spine. There is disc space
narrowing and mild retrolisthesis at L5-S1 level.
